# Patient Record
Sex: Female | Born: 1975 | Race: Black or African American | Hispanic: No | Smoking: Never smoker
Health system: Southern US, Community
[De-identification: ages and names within clinical notes are randomized; demographics above are authoritative.]

## PROBLEM LIST (undated history)

## (undated) DIAGNOSIS — M1990 Unspecified osteoarthritis, unspecified site: Secondary | ICD-10-CM

## (undated) DIAGNOSIS — G629 Polyneuropathy, unspecified: Secondary | ICD-10-CM

## (undated) HISTORY — PX: TYMPANOSTOMY TUBE PLACEMENT: SHX32

## (undated) HISTORY — PX: TONSILLECTOMY AND ADENOIDECTOMY: SUR1326

## (undated) HISTORY — PX: SOFT TISSUE BIOPSY: SHX1053

---

## 2000-06-26 ENCOUNTER — Other Ambulatory Visit: Admission: RE | Admit: 2000-06-26 | Discharge: 2000-06-26 | Payer: Self-pay | Admitting: General Surgery

## 2000-06-26 ENCOUNTER — Encounter (INDEPENDENT_AMBULATORY_CARE_PROVIDER_SITE_OTHER): Payer: Self-pay

## 2003-04-23 ENCOUNTER — Other Ambulatory Visit: Admission: RE | Admit: 2003-04-23 | Discharge: 2003-04-23 | Payer: Self-pay | Admitting: Family Medicine

## 2004-09-14 ENCOUNTER — Other Ambulatory Visit: Admission: RE | Admit: 2004-09-14 | Discharge: 2004-09-14 | Payer: Self-pay | Admitting: Emergency Medicine

## 2005-10-27 ENCOUNTER — Other Ambulatory Visit: Admission: RE | Admit: 2005-10-27 | Discharge: 2005-10-27 | Payer: Self-pay | Admitting: Emergency Medicine

## 2007-04-16 ENCOUNTER — Encounter: Admission: RE | Admit: 2007-04-16 | Discharge: 2007-04-16 | Payer: Self-pay | Admitting: Emergency Medicine

## 2007-07-12 ENCOUNTER — Other Ambulatory Visit: Admission: RE | Admit: 2007-07-12 | Discharge: 2007-07-12 | Payer: Self-pay | Admitting: Obstetrics and Gynecology

## 2008-07-27 ENCOUNTER — Other Ambulatory Visit: Admission: RE | Admit: 2008-07-27 | Discharge: 2008-07-27 | Payer: Self-pay | Admitting: Obstetrics and Gynecology

## 2010-01-21 ENCOUNTER — Other Ambulatory Visit: Admission: RE | Admit: 2010-01-21 | Discharge: 2010-01-21 | Payer: Self-pay | Admitting: Family Medicine

## 2010-11-13 ENCOUNTER — Encounter: Payer: Self-pay | Admitting: Family Medicine

## 2013-07-30 ENCOUNTER — Ambulatory Visit (INDEPENDENT_AMBULATORY_CARE_PROVIDER_SITE_OTHER): Payer: Self-pay | Admitting: General Surgery

## 2013-08-20 ENCOUNTER — Encounter (INDEPENDENT_AMBULATORY_CARE_PROVIDER_SITE_OTHER): Payer: Self-pay | Admitting: General Surgery

## 2013-08-20 ENCOUNTER — Encounter (INDEPENDENT_AMBULATORY_CARE_PROVIDER_SITE_OTHER): Payer: Self-pay

## 2013-08-20 ENCOUNTER — Ambulatory Visit (INDEPENDENT_AMBULATORY_CARE_PROVIDER_SITE_OTHER): Payer: BC Managed Care – PPO | Admitting: General Surgery

## 2013-08-20 VITALS — BP 136/72 | HR 60 | Temp 97.1°F | Resp 16 | Ht 64.75 in | Wt 173.0 lb

## 2013-08-20 DIAGNOSIS — K602 Anal fissure, unspecified: Secondary | ICD-10-CM

## 2013-08-20 MED ORDER — LIDOCAINE 5 % EX OINT: TOPICAL_OINTMENT | CUTANEOUS | Status: DC | PRN

## 2013-08-20 NOTE — Patient Instructions (Signed)

## 2013-08-20 NOTE — Progress Notes (Signed)
Chief Complaint  Patient presents with  . New Evaluation    eval hems    HISTORY: Judith Foster is a 37 y.o. female who presents to the office with rectal bleeding with BM's.  Other symptoms include anal pain.  This had been occurring for months.  She has tried proctofoam and anusol in the past which made it worse.    BM's makes the symptoms worse.   It is intermittent in nature.  Her bowel habits are regular and her bowel movements are soft.  She does report inferior to have bowel movements.  She does strain to have BM's.  She states occasional sharp tearing pain with bowel movements.  Her fiber intake is dietary.  She has never had a colonoscopy.  She denies prolapsing tissue.     History reviewed. No pertinent past medical history.    Past Surgical History  Procedure Laterality Date  . Soft tissue biopsy    . Tympanostomy tube placement          Current Outpatient Prescriptions  Medication Sig Dispense Refill  . lidocaine (XYLOCAINE) 5 % ointment Apply topically as needed.  35.44 g  0  . valACYclovir (VALTREX) 500 MG tablet Take 500 mg by mouth 2 (two) times daily.       No current facility-administered medications for this visit.      Allergies  Allergen Reactions  . Asa [Aspirin] Swelling    Swelling of eyes and face  . Meloxicam Swelling    Swelling of eyes and face  . Phenergan [Promethazine Hcl] Nausea Only      Family History  Problem Relation Age of Onset  . Cancer Maternal Grandfather     ?prostate    History   Social History  . Marital Status: Divorced    Spouse Name: N/A    Number of Children: N/A  . Years of Education: N/A   Social History Main Topics  . Smoking status: Never Smoker   . Smokeless tobacco: Never Used  . Alcohol Use: No  . Drug Use: No  . Sexual Activity: None   Other Topics Concern  . None   Social History Narrative  . None      REVIEW OF SYSTEMS - PERTINENT POSITIVES ONLY: Review of Systems - General ROS: negative for -  chills, fever or weight loss Hematological and Lymphatic ROS: negative for - bleeding problems, blood clots or bruising Respiratory ROS: no cough, shortness of breath, or wheezing Cardiovascular ROS: no chest pain or dyspnea on exertion Gastrointestinal ROS: positive for - blood in stools negative for - abdominal pain, change in bowel habits, constipation or diarrhea Genito-Urinary ROS: no dysuria, trouble voiding, or hematuria  EXAM: Filed Vitals:   08/20/13 1504  BP: 136/72  Pulse: 60  Temp: 97.1 F (36.2 C)  Resp: 16    General appearance: alert and cooperative Resp: clear to auscultation bilaterally Cardio: regular rate and rhythm GI: normal findings: soft, non-tender  Anal Findings: posterior fissure, sphincter hypertension    ASSESSMENT AND PLAN: Judith Foster is a 37 year old female with signs and symptoms most consistent with a posterior anal fissure. I have given her a prescription for lidocaine ointment to use prior to bowel movements. She's going to do sitz baths after bowel movements. She's going to use diltiazem cream 3-4 times a day to help with her fissure healing. We discussed that she may not notice any difference for the first couple weeks but then it should gradually started to get better.  She'll call the office if this is not the case after 4 weeks. I will see her at 8 weeks at the end of her diltiazem treatment. We may need to do further anoscopy is she is still continuing to have anal bleeding.    Vanita Panda, MD Colon and Rectal Surgery / General Surgery [facility]      Visit Diagnoses: 1. Anal fissure     Primary Care Physician: Carilyn Goodpasture, PA-C

## 2013-10-09 ENCOUNTER — Encounter (INDEPENDENT_AMBULATORY_CARE_PROVIDER_SITE_OTHER): Payer: BC Managed Care – PPO | Admitting: General Surgery

## 2013-11-05 ENCOUNTER — Encounter (INDEPENDENT_AMBULATORY_CARE_PROVIDER_SITE_OTHER): Payer: Self-pay | Admitting: General Surgery

## 2013-11-05 ENCOUNTER — Encounter (INDEPENDENT_AMBULATORY_CARE_PROVIDER_SITE_OTHER): Payer: Self-pay

## 2013-11-05 ENCOUNTER — Ambulatory Visit (INDEPENDENT_AMBULATORY_CARE_PROVIDER_SITE_OTHER): Payer: BC Managed Care – PPO | Admitting: General Surgery

## 2013-11-05 VITALS — BP 123/81 | HR 77 | Temp 98.1°F | Resp 18 | Ht 64.0 in | Wt 175.8 lb

## 2013-11-05 DIAGNOSIS — K6289 Other specified diseases of anus and rectum: Secondary | ICD-10-CM

## 2013-11-05 NOTE — Patient Instructions (Signed)
Take a stool softener twice daily or Miralax daily.  Continue diltiazem ointment.  Call the office if your symptoms are not better in the next 4-6 weeks

## 2013-11-05 NOTE — Progress Notes (Signed)
Judith Foster is a 38 y.o. female who is here for a follow up visit regarding her anal pain.  Her bleeding is resolved but she is still having pain intermittently.  She denies any consistent straining or hard BM's, but she still has some hard BM's on occasion.   Objective: Filed Vitals:   11/05/13 1422  BP: 123/81  Pulse: 77  Temp: 98.1 F (36.7 C)  Resp: 18    General appearance: alert and cooperative GI: normal findings: soft, non-tender Anal exam: I see no evidence of fissure at this time. 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 I was able to examine her anal canal with a small endoscope and did not see any major signs of pathology.  Assessment and Plan: We had a discussion on where to go next. She would like to use stool softeners for the next few weeks while continuing the diltiazem ointment. She will call the office if this does not help relieve her symptoms. I believe the next up in her diagnosis would be in anal exam under anesthesia, as she is unable to tolerate a full anal exam and the office.    Vanita Panda.Candida Vetter C Afsheen Antony, MD [facility], [place] [address] [phone]

## 2014-03-20 ENCOUNTER — Other Ambulatory Visit: Payer: Self-pay | Admitting: Family Medicine

## 2014-03-20 ENCOUNTER — Ambulatory Visit
Admission: RE | Admit: 2014-03-20 | Discharge: 2014-03-20 | Disposition: A | Discharge status: Home / Self Care | Payer: BC Managed Care – PPO | Source: Ambulatory Visit | Attending: Family Medicine | Admitting: Family Medicine

## 2014-03-20 DIAGNOSIS — M25519 Pain in unspecified shoulder: Secondary | ICD-10-CM

## 2015-07-30 ENCOUNTER — Other Ambulatory Visit (HOSPITAL_COMMUNITY)
Admission: RE | Admit: 2015-07-30 | Discharge: 2015-07-30 | Disposition: A | Discharge status: Home / Self Care | Payer: BC Managed Care – PPO | Source: Ambulatory Visit | Attending: Family Medicine | Admitting: Family Medicine

## 2015-07-30 ENCOUNTER — Other Ambulatory Visit: Payer: Self-pay | Admitting: Family Medicine

## 2015-07-30 DIAGNOSIS — Z1151 Encounter for screening for human papillomavirus (HPV): Secondary | ICD-10-CM | POA: Diagnosis present

## 2015-07-30 DIAGNOSIS — Z01411 Encounter for gynecological examination (general) (routine) with abnormal findings: Secondary | ICD-10-CM | POA: Diagnosis present

## 2015-08-03 LAB — CYTOLOGY - PAP

## 2016-05-23 ENCOUNTER — Ambulatory Visit
Admission: RE | Admit: 2016-05-23 | Discharge: 2016-05-23 | Disposition: A | Discharge status: Home / Self Care | Payer: BC Managed Care – PPO | Source: Ambulatory Visit | Attending: Family Medicine | Admitting: Family Medicine

## 2016-05-23 ENCOUNTER — Other Ambulatory Visit: Payer: Self-pay | Admitting: Family Medicine

## 2016-05-23 DIAGNOSIS — N760 Acute vaginitis: Secondary | ICD-10-CM

## 2017-09-07 NOTE — Patient Instructions (Signed)
Your procedure is scheduled on:  Wednesday, Dec. 5, 2018  [facility]:  11:00 AM  Pick up the phone at the desk and dial [phone].  Call this number if you have problems the morning of surgery: [phone].  Remember: Do NOT eat food or drink after:  Midnight Tuesday  Take these medicines the morning of surgery with a SIP OF WATER:  None  Stop ALL herbal medications at this time  Do NOT smoke the day of surgery.  Do NOT wear jewelry (body piercing), metal hair clips/bobby pins, make-up, artifical eyelashes or nail polish. Do NOT wear lotions, powders, or perfumes.  You may wear deodorant. Do NOT shave for 48 hours prior to surgery. Do NOT bring valuables to the hospital. Contacts, dentures, or bridgework may not be worn into surgery.  Have a responsible adult drive you home and stay with you for 24 hours after your procedure  Bring a copy of your healthcare power of attorney and living will documents.  [facility] - Preparing for Surgery Before surgery, you can play an important role.  Because skin is not sterile, your skin needs to be as free of germs as possible.  You can reduce the number of germs on your skin by washing with CHG (chlorahexidine gluconate) soap before surgery.  CHG is an antiseptic cleaner which kills germs and bonds with the skin to continue killing germs even after washing. Please DO NOT use if you have an allergy to CHG or antibacterial soaps.  If your skin becomes reddened/irritated stop using the CHG and inform your nurse when you arrive at Short Stay. Do not shave (including legs and underarms) for at least 48 hours prior to the first CHG shower.  You may shave your face/neck.  Please follow these instructions carefully:  1.  Shower with CHG Soap the night before surgery and the  morning of surgery.  2.  If you choose to wash your hair, wash your hair first as usual with your normal  shampoo.  3.  After you  shampoo, rinse your hair and body thoroughly to remove the shampoo.                             4.  Use CHG as you would any other liquid soap.  You can apply chg directly to the skin and wash.  Gently with a scrungie or clean washcloth.  5.  Apply the CHG Soap to your body ONLY FROM THE NECK DOWN.   Do   not use on face/ open                           Wound or open sores. Avoid contact with eyes, ears mouth and   genitals (private parts).                       Wash face,  Genitals (private parts) with your normal soap.             6.  Wash thoroughly, paying special attention to the area where your    surgery  will be performed.  7.  Thoroughly rinse your body with warm water from the neck down.  8.  DO NOT shower/wash with your normal soap after using and rinsing off the CHG Soap.  9.  Pat yourself dry with a clean towel.            10.  Wear clean pajamas.            11.  Place clean sheets on your bed the night of your first shower and do not  sleep with pets. Day of Surgery : Do not apply any lotions/deodorants the morning of surgery.  Please wear clean clothes to the hospital/surgery center.  FAILURE TO FOLLOW THESE INSTRUCTIONS MAY RESULT IN THE CANCELLATION OF YOUR SURGERY  PATIENT SIGNATURE_________________________________  NURSE SIGNATURE__________________________________  ________________________________________________________________________

## 2017-09-17 ENCOUNTER — Other Ambulatory Visit: Payer: Self-pay | Admitting: Obstetrics and Gynecology

## 2017-09-17 NOTE — H&P (Signed)
Chief Complaint(s):   PreOp for 09/26/17 ocmplex right ovarian cyst   HPI:  General 41 y/o presents for preop history and physical exam in preparation for laparoscopic Right ovarian cystectomy with possible right oophorectomy. for management of right ovarian mass. her ultrasound 07/26/2017 revealed a 10.2 cm x 6.4 cm x 7.1 cm uterus. the endometrium is thin 3.5 mm. she has a posterior submucosal fibroid 4.3 cm anterior 1.3 cm. fibroids are stable from previous ultrasound 05/23/2017. the right Ovary contains a complex hyperechoic mass with posterior shadowing measuring 4.7 cm . it is avascular and has appearance of a possible dermoid. the left ovary appears normal.  Current Medication:   None   Medical History:   GYN - [facility] - Dr. Richardson Doppole in past     infertility     HSV-II     CAN TAKE IBUPROFEN/ADVIL     ortho- Dr. Althea CharonMcKinley     ulnar neuropathy     onset bilateral hand pain- relieved with steroids- (+) FH of RA- neg arthritis panel     arthritis in neck      Allergies/Intolerance:   Aspirin: Allergy - swelling     Phenergan: Side Effects - nausea/dizziness     Meloxicam: Allergy - eyes swelling     Keflex - ?hives 10/22/15 TE     Bactrim DS - ?hives 10/22/15- TE   Gyn History:   Sexual activity currently sexually active at times. Periods : regular. LMP 09/16/2017. Birth control none. Last pap smear date 07/30/15 - WNL negative HPV. Denies Last mammogram date. Denies Abnormal pap smear. STD HSV II. Menarche 8.   OB History:   Never been pregnant per patient.   Surgical History:   tonsillectomy 06/1998     underarm biopsy 06/2000     tubes in ears 1970's   Hospitalization:   tonsillectomy 1970's   Family History:   Father: alive 7170 yrs, smoker    Mother: alive 6968 yrs, high blood pressure, fibromyalgia, arthritis, diagnosed with Hypertension    Paternal Grand Father: deceased    Paternal Grand Mother: deceased    Maternal Grand Father: deceased, cancer    Maternal Grand Mother: deceased, arthritis- ? type    Brother 1: alive 32 yrs, A + W    1 brother(s) .    No hx of Breast, Ovarian , or Colon Cancer. Aunt age 42 had stroke. (+) DM in 2nd degree relatives.  Social History:  General Tobacco use cigarettes: Never smoked, Tobacco history last updated 09/17/2017.  no EXPOSURE TO PASSIVE SMOKE, in the past - father.  Alcohol: yes, 1 serving per year.  Caffeine: yes, tea 1 serving per month.  no Recreational drug use.  Exercise: yes, gym, 4x a week.  Marital Status: single, Divorced.  Children: none.  OCCUPATION: employed, computer/technology work.  ROS: CONSTITUTIONAL No" options="no,yes" propid="91" itemid="193425" categoryid="10464" encounterid="[id number]"Chills No. No" options="no,yes" propid="91" itemid="172899" categoryid="10464" encounterid="[id number]"Fatigue No. No" options="no,yes" propid="91" itemid="10467" categoryid="10464" encounterid="[id number]"Fever No. No" options="no,yes" propid="91" itemid="193426" categoryid="10464" encounterid="[id number]"Night sweats No. No" options="no,yes" propid="91" itemid="444261" categoryid="10464" encounterid="[id number]"Recent travel outside [place] No. No" options="no,yes" propid="91" itemid="193427" categoryid="10464" encounterid="[id number]"Sweats No. No" options="no,yes" propid="91" itemid="194825" categoryid="10464" encounterid="[id number]"Weight change No.  OPHTHALMOLOGY no" options="no,yes" propid="91" itemid="12520" categoryid="12516" encounterid="[id number]"Blurring of vision no. no" options="no,yes" propid="91" itemid="193469" categoryid="12516" encounterid="[id number]"Change in vision no. no" options="no,yes" propid="91" itemid="194379" categoryid="12516" encounterid="[id number]"Double vision no.  ENT no" options="no,yes" propid="91" itemid="193612" categoryid="10481" encounterid="[id number]"Dizziness no. Nose bleeds no. Sore throat no. Teeth pain no.  ALLERGY no" options="no,yes" propid="91" itemid="202589"  categoryid="138152" encounterid="[id number]"Hives no.  CARDIOLOGY no" options="no,yes"  propid="91" [phone]" categoryid="10488" encounterid="[id number]"Chest pain no. no" options="no,yes" propid="91" itemid="199089" categoryid="10488" encounterid="[id number]"High blood pressure no. no" options="no,yes" propid="91" itemid="202598" categoryid="10488" encounterid="[id number]"Irregular heart beat no. no" options="no,yes" propid="91" itemid="10491" categoryid="10488" encounterid="[id number]"Leg edema no. no" options="no,yes" propid="91" itemid="10490" categoryid="10488" encounterid="[id number]"Palpitations no.  RESPIRATORY no" options="no" propid="91" itemid="270013" categoryid="138132" encounterid="[id number]"Shortness of breath no. no" options="no,yes" propid="91" itemid="172745" categoryid="138132" encounterid="[id number]"Cough no. no" options="no,yes" propid="91" itemid="193621" categoryid="138132" encounterid="[id number]"Wheezing no.  UROLOGY no" options="no,yes" propid="91" [phone]" categoryid="138166" encounterid="[id number]"Pain with urination no. no" options="no,yes" propid="91" itemid="193493" categoryid="138166" encounterid="[id number]"Urinary urgency no. no" options="no,yes" propid="91" itemid="193492" categoryid="138166" encounterid="[id number]"Urinary frequency no. no" options="no,yes" propid="91" itemid="138171" categoryid="138166" encounterid="[id number]"Urinary incontinence no. No" options="no,yes" propid="91" itemid="138167" categoryid="138166" encounterid="[id number]"Difficulty urinating No. No" options="no,yes" propid="91" itemid="138168" categoryid="138166" encounterid="[id number]"Blood in urine No.  GASTROENTEROLOGY no" options="no,yes" propid="91" itemid="10496" categoryid="10494" encounterid="[id number]"Abdominal pain no. no" options="no,yes" propid="91" itemid="193447" categoryid="10494" encounterid="[id number]"Appetite change no. no" options="no,yes" propid="91" itemid="193448" categoryid="10494"  encounterid="[id number]"Bloating/belching no. no" options="no,yes" propid="91" itemid="10503" categoryid="10494" encounterid="[id number]"Blood in stool or on toilet paper no. no" options="no,yes" propid="91" itemid="199106" categoryid="10494" encounterid="[id number]"Change in bowel movements no. no" options="no,yes" propid="91" itemid="10501" categoryid="10494" encounterid="[id number]"Constipation no. no" options="no,yes" propid="91" itemid="10502" categoryid="10494" encounterid="[id number]"Diarrhea no. no" options="no,yes" propid="91" itemid="199104" categoryid="10494" encounterid="[id number]"Difficulty swallowing no. no" options="no,yes" propid="91" itemid="10499" categoryid="10494" encounterid="[id number]"Nausea no.  FEMALE REPRODUCTIVE no" options="no,yes" propid="91" itemid="453725" categoryid="10525" encounterid="[id number]"Vulvar pain no. no" options="no,yes" propid="91" itemid="453726" categoryid="10525" encounterid="[id number]"Vulvar rash no. no" options="no, yes" propid="91" itemid="444315" categoryid="10525" encounterid="[id number]"Abnormal vaginal bleeding no. no" options="no,yes" propid="91" itemid="186083" categoryid="10525" encounterid="[id number]"Breast pain no. no" options="no,yes" propid="91" itemid="186084" categoryid="10525" encounterid="[id number]"Nipple discharge no. no" options="no,yes" propid="91" itemid="275823" categoryid="10525" encounterid="[id number]"Pain with intercourse no. no" options="no,yes" propid="91" itemid="186082" categoryid="10525" encounterid="[id number]"Pelvic pain no. no" options="no,yes" propid="91" itemid="278230" categoryid="10525" encounterid="[id number]"Unusual vaginal discharge no. no" options="no,yes" propid="91" itemid="278942" categoryid="10525" encounterid="[id number]"Vaginal itching no.  MUSCULOSKELETAL no" options="no,yes" propid="91" itemid="193461" categoryid="10514" encounterid="[id number]"Muscle aches no.  NEUROLOGY no" options="no,yes" propid="91" itemid="12513" categoryid="12512"  encounterid="[id number]"Headache no. no" options="no,yes" propid="91" itemid="12514" categoryid="12512" encounterid="[id number]"Tingling/numbness no. no" options="no,yes" propid="91" itemid="193468" categoryid="12512" encounterid="9875914"Weakness no.  PSYCHOLOGY no" options="" propid="91" itemid="275919" categoryid="10520" encounterid="9875914"Depression no. no" options="no,yes" propid="91" itemid="172748" categoryid="10520" encounterid="[id number]"Anxiety no. no" options="no,yes" propid="91" itemid="199158" categoryid="10520" encounterid="9875914"Nervousness no. no" options="no,yes" propid="91" itemid="12502" categoryid="10520" encounterid="9875914"Sleep disturbances no. no " options="no,yes" propid="91" itemid="72718" categoryid="10520" encounterid="9875914"Suicidal ideation no .  ENDOCRINOLOGY no" options="no,yes" propid="91" itemid="194628" categoryid="12508" encounterid="[id number]"Excessive thirst no. no" options="no,yes" propid="91" itemid="196285" categoryid="12508" encounterid="9875914"Excessive urination no. no" options="no, yes" propid="91" itemid="444314" categoryid="12508" encounterid="9875914"Hair loss no. no" options="" propid="91" itemid="447284" categoryid="12508" encounterid="[id number]"Heat or cold intolerance no.  HEMATOLOGY/LYMPH no" options="no,yes" propid="91" itemid="199152" categoryid="138157" encounterid="9875914"Abnormal bleeding no. no" options="no,yes" propid="91" itemid="170653" categoryid="138157" encounterid="9875914"Easy bruising no. no" options="no,yes" propid="91" itemid="138158" categoryid="138157" encounterid="9875914"Swollen glands no.  DERMATOLOGY no" options="no,yes" propid="91" itemid="199126" categoryid="12503" encounterid="[id number]"New/changing skin lesion no. no" options="no,yes" propid="91" itemid="12504" categoryid="12503" encounterid="9875914"Rash no. no" options="" propid="91" itemid="444313" categoryid="12503" encounterid="[id number]"Sores no.  Negative except as stated in  HPI.   Objective: Vitals:  Wt 182, Wt change -3 lb, Ht 63.5, BMI 31.73, Pulse sitting 72, BP sitting 122/80  Past Results:  Examination:  General Examination alert, oriented, NAD " categoryPropId="10089" examid="193638"GENERAL APPEARANCE alert, oriented, NAD .  moist, warm " categoryPropId="10109" examid="193638"SKIN: moist, warm .  Conjunctiva clear " categoryPropId="21468" examid="193638"EYES: Conjunctiva clear .  clear to auscultation bilaterally " categoryPropId="87" examid="193638"LUNGS: clear to auscultation bilaterally .  regular rate and rhythm " categoryPropId="86" examid="193638"HEART: regular rate and rhythm .  soft, non-tender/non-distended, bowel sounds present " categoryPropId="88" examid="193638"ABDOMEN: soft, non-tender/non-distended, bowel sounds present .  normal external genitalia, labia - unremarkable, vagina - pink moist mucosa, no lesions or abnormal discharge, cervix - no discharge or lesions or CMT, adnexa - no masses or tenderness, uterus - nontender and normal size on palpation " categoryPropId="13414" examid="193638"FEMALE GENITOURINARY: normal external genitalia, labia - unremarkable, vagina - pink moist mucosa, no lesions or  abnormal discharge, cervix - no discharge or lesions or CMT, adnexa - no masses or tenderness, uterus - nontender and normal size on palpation .  normal range of motion, no edema present" categoryPropId="89" examid="193638"EXTREMITIES: normal range of motion, no edema present.  affect normal, good eye contact " categoryPropId="16316" examid="193638"PSYCH: affect normal, good eye contact .  Physical Examination:   Assessment: Assessment:  Complex cyst of right ovary - N83.291 (Primary)     Plan: Treatment:  Complex cyst of right ovary  Notes: laparoscopic right ovarian cystectomy possbile Right oophorectomy discussed with the patient including but not limited to infection / bleeding damage to bowel bladder ureters with the need for further  surgery. Pt voiced understanding and desires to proceed.

## 2017-09-17 NOTE — H&P (Deleted)
  The note originally documented on this encounter has been moved the the encounter in which it belongs.  

## 2017-09-18 ENCOUNTER — Other Ambulatory Visit: Payer: Self-pay

## 2017-09-18 ENCOUNTER — Encounter (HOSPITAL_COMMUNITY): Payer: Self-pay

## 2017-09-18 ENCOUNTER — Encounter (HOSPITAL_COMMUNITY)
Admission: RE | Admit: 2017-09-18 | Discharge: 2017-09-18 | Disposition: A | Discharge status: Home / Self Care | Payer: BC Managed Care – PPO | Source: Ambulatory Visit | Attending: Obstetrics and Gynecology | Admitting: Obstetrics and Gynecology

## 2017-09-18 DIAGNOSIS — Z01818 Encounter for other preprocedural examination: Secondary | ICD-10-CM | POA: Diagnosis present

## 2017-09-18 HISTORY — DX: Polyneuropathy, unspecified: G62.9

## 2017-09-18 HISTORY — DX: Unspecified osteoarthritis, unspecified site: M19.90

## 2017-09-18 LAB — CBC
HEMATOCRIT: 37.2 % (ref 36.0–46.0)
HEMOGLOBIN: 12.2 g/dL (ref 12.0–15.0)
MCH: 28.8 pg (ref 26.0–34.0)
MCHC: 32.8 g/dL (ref 30.0–36.0)
MCV: 87.9 fL (ref 78.0–100.0)
PLATELETS: 331 10*3/uL (ref 150–400)
RBC: 4.23 MIL/uL (ref 3.87–5.11)
RDW: 12.7 % (ref 11.5–15.5)
WBC: 5.8 10*3/uL (ref 4.0–10.5)

## 2017-09-18 LAB — TYPE AND SCREEN
ABO/RH(D): O POS
Antibody Screen: NEGATIVE

## 2017-09-18 LAB — ABO/RH: ABO/RH(D): O POS

## 2017-09-26 ENCOUNTER — Ambulatory Visit (HOSPITAL_COMMUNITY): Payer: BC Managed Care – PPO | Admitting: Anesthesiology

## 2017-09-26 ENCOUNTER — Encounter (HOSPITAL_COMMUNITY): Payer: Self-pay

## 2017-09-26 ENCOUNTER — Ambulatory Visit (HOSPITAL_COMMUNITY)
Admission: AD | Admit: 2017-09-26 | Discharge: 2017-09-26 | Disposition: A | Discharge status: Home / Self Care | Payer: BC Managed Care – PPO | Source: Ambulatory Visit | Attending: Obstetrics and Gynecology | Admitting: Obstetrics and Gynecology

## 2017-09-26 ENCOUNTER — Encounter (HOSPITAL_COMMUNITY)
Admission: AD | Disposition: A | Discharge status: Home / Self Care | Payer: Self-pay | Source: Ambulatory Visit | Attending: Obstetrics and Gynecology

## 2017-09-26 DIAGNOSIS — M1990 Unspecified osteoarthritis, unspecified site: Secondary | ICD-10-CM | POA: Diagnosis not present

## 2017-09-26 DIAGNOSIS — N83201 Unspecified ovarian cyst, right side: Secondary | ICD-10-CM | POA: Diagnosis present

## 2017-09-26 DIAGNOSIS — N83291 Other ovarian cyst, right side: Secondary | ICD-10-CM | POA: Diagnosis present

## 2017-09-26 HISTORY — PX: LAPAROSCOPIC OVARIAN CYSTECTOMY: SHX6248

## 2017-09-26 LAB — PREGNANCY, URINE: Preg Test, Ur: NEGATIVE

## 2017-09-26 SURGERY — EXCISION, CYST, OVARY, LAPAROSCOPIC
Anesthesia: General | Site: Abdomen | Laterality: Right

## 2017-09-26 MED ORDER — IBUPROFEN 800 MG PO TABS: 800.0000 mg | ORAL_TABLET | Freq: Three times a day (TID) | ORAL | 0 refills | Status: AC | PRN

## 2017-09-26 MED ORDER — SCOPOLAMINE 1 MG/3DAYS TD PT72
1.0000 | MEDICATED_PATCH | Freq: Once | TRANSDERMAL | Status: DC
Start: 2017-09-26 — End: 2017-09-27
  Administered 2017-09-26: 1.5 mg via TRANSDERMAL

## 2017-09-26 MED ORDER — LIDOCAINE HCL (CARDIAC) 20 MG/ML IV SOLN
INTRAVENOUS | Status: AC
  Filled 2017-09-26: qty 5

## 2017-09-26 MED ORDER — GLYCOPYRROLATE 0.2 MG/ML IJ SOLN
INTRAMUSCULAR | Status: DC | PRN
  Administered 2017-09-26 (×2): 0.1 mg via INTRAVENOUS

## 2017-09-26 MED ORDER — HYDROCODONE-ACETAMINOPHEN 5-325 MG PO TABS: 1.0000 | ORAL_TABLET | Freq: Four times a day (QID) | ORAL | 0 refills | Status: AC | PRN

## 2017-09-26 MED ORDER — LACTATED RINGERS IV SOLN
INTRAVENOUS | Status: DC
Start: 2017-09-26 — End: 2017-09-27
  Administered 2017-09-26 (×3): via INTRAVENOUS

## 2017-09-26 MED ORDER — MIDAZOLAM HCL 2 MG/2ML IJ SOLN
INTRAMUSCULAR | Status: DC | PRN
  Administered 2017-09-26: 2 mg via INTRAVENOUS

## 2017-09-26 MED ORDER — SUGAMMADEX SODIUM 200 MG/2ML IV SOLN
INTRAVENOUS | Status: DC | PRN
  Administered 2017-09-26: 160 mg via INTRAVENOUS

## 2017-09-26 MED ORDER — PROPOFOL 10 MG/ML IV BOLUS
INTRAVENOUS | Status: DC | PRN
  Administered 2017-09-26: 200 mg via INTRAVENOUS

## 2017-09-26 MED ORDER — PROPOFOL 10 MG/ML IV BOLUS
INTRAVENOUS | Status: AC
  Filled 2017-09-26: qty 20

## 2017-09-26 MED ORDER — DEXAMETHASONE SODIUM PHOSPHATE 10 MG/ML IJ SOLN
INTRAMUSCULAR | Status: DC | PRN
  Administered 2017-09-26: 8 mg via INTRAVENOUS

## 2017-09-26 MED ORDER — FENTANYL CITRATE (PF) 250 MCG/5ML IJ SOLN
INTRAMUSCULAR | Status: AC
  Filled 2017-09-26: qty 5

## 2017-09-26 MED ORDER — SUGAMMADEX SODIUM 200 MG/2ML IV SOLN
INTRAVENOUS | Status: AC
  Filled 2017-09-26: qty 2

## 2017-09-26 MED ORDER — ONDANSETRON HCL 4 MG/2ML IJ SOLN
INTRAMUSCULAR | Status: DC | PRN
  Administered 2017-09-26: 4 mg via INTRAVENOUS

## 2017-09-26 MED ORDER — MIDAZOLAM HCL 2 MG/2ML IJ SOLN
INTRAMUSCULAR | Status: AC
  Filled 2017-09-26: qty 2

## 2017-09-26 MED ORDER — DEXAMETHASONE SODIUM PHOSPHATE 10 MG/ML IJ SOLN
INTRAMUSCULAR | Status: AC
  Filled 2017-09-26: qty 1

## 2017-09-26 MED ORDER — ROCURONIUM BROMIDE 100 MG/10ML IV SOLN
INTRAVENOUS | Status: DC | PRN
  Administered 2017-09-26: 5 mg via INTRAVENOUS
  Administered 2017-09-26: 35 mg via INTRAVENOUS

## 2017-09-26 MED ORDER — LIDOCAINE HCL (CARDIAC) 20 MG/ML IV SOLN
INTRAVENOUS | Status: DC | PRN
  Administered 2017-09-26: 80 mg via INTRAVENOUS

## 2017-09-26 MED ORDER — KETOROLAC TROMETHAMINE 30 MG/ML IJ SOLN
INTRAMUSCULAR | Status: AC
  Filled 2017-09-26: qty 1

## 2017-09-26 MED ORDER — FENTANYL CITRATE (PF) 100 MCG/2ML IJ SOLN
INTRAMUSCULAR | Status: DC | PRN
  Administered 2017-09-26 (×4): 50 ug via INTRAVENOUS

## 2017-09-26 MED ORDER — GLYCOPYRROLATE 0.2 MG/ML IJ SOLN
INTRAMUSCULAR | Status: AC
  Filled 2017-09-26: qty 1

## 2017-09-26 MED ORDER — ONDANSETRON HCL 4 MG/2ML IJ SOLN
INTRAMUSCULAR | Status: AC
  Filled 2017-09-26: qty 2

## 2017-09-26 MED ORDER — SODIUM CHLORIDE 0.9 % IR SOLN
Status: DC | PRN
  Administered 2017-09-26: 3000 mL

## 2017-09-26 MED ORDER — ROCURONIUM BROMIDE 100 MG/10ML IV SOLN
INTRAVENOUS | Status: AC
  Filled 2017-09-26: qty 1

## 2017-09-26 MED ORDER — FENTANYL CITRATE (PF) 100 MCG/2ML IJ SOLN
25.0000 ug | INTRAMUSCULAR | Status: DC | PRN
Start: 2017-09-26 — End: 2017-09-27

## 2017-09-26 MED ORDER — BUPIVACAINE HCL (PF) 0.25 % IJ SOLN
INTRAMUSCULAR | Status: DC | PRN
  Administered 2017-09-26: 30 mL

## 2017-09-26 MED ORDER — SCOPOLAMINE 1 MG/3DAYS TD PT72
MEDICATED_PATCH | TRANSDERMAL | Status: AC
  Administered 2017-09-26: 1.5 mg via TRANSDERMAL
  Filled 2017-09-26: qty 1

## 2017-09-26 SURGICAL SUPPLY — 32 items
BAG SPEC RTRVL LRG 6X4 10 (ENDOMECHANICALS) ×1
CABLE HIGH FREQUENCY MONO STRZ (ELECTRODE) IMPLANT
DILATOR CANAL MILEX (MISCELLANEOUS) ×2 IMPLANT
DRSG OPSITE POSTOP 3X4 (GAUZE/BANDAGES/DRESSINGS) ×2 IMPLANT
GLOVE BIOGEL M 6.5 STRL (GLOVE) ×6 IMPLANT
GLOVE BIOGEL PI IND STRL 6.5 (GLOVE) ×1 IMPLANT
GLOVE BIOGEL PI IND STRL 7.0 (GLOVE) ×1 IMPLANT
GLOVE BIOGEL PI INDICATOR 6.5 (GLOVE) ×2
GLOVE BIOGEL PI INDICATOR 7.0 (GLOVE) ×2
GOWN STRL REUS W/TWL LRG LVL3 (GOWN DISPOSABLE) ×6 IMPLANT
NS IRRIG 1000ML POUR BTL (IV SOLUTION) ×3 IMPLANT
PACK LAPAROSCOPY BASIN (CUSTOM PROCEDURE TRAY) ×3 IMPLANT
PACK TRENDGUARD 450 HYBRID PRO (MISCELLANEOUS) IMPLANT
PACK TRENDGUARD 600 HYBRD PROC (MISCELLANEOUS) IMPLANT
POUCH SPECIMEN RETRIEVAL 10MM (ENDOMECHANICALS) ×3 IMPLANT
PROTECTOR NERVE ULNAR (MISCELLANEOUS) ×6 IMPLANT
SEALER TISSUE G2 CVD JAW 35 (ENDOMECHANICALS) IMPLANT
SEALER TISSUE G2 CVD JAW 45CM (ENDOMECHANICALS) IMPLANT
SET IRRIG TUBING LAPAROSCOPIC (IRRIGATION / IRRIGATOR) ×2 IMPLANT
SHEARS HARMONIC ACE PLUS 36CM (ENDOMECHANICALS) ×2 IMPLANT
SOLUTION ELECTROLUBE (MISCELLANEOUS) IMPLANT
SUT VIC AB 0 CTX 36 (SUTURE)
SUT VIC AB 0 CTX36XBRD ANBCTRL (SUTURE) IMPLANT
SUT VIC AB 4-0 PS2 27 (SUTURE) ×3 IMPLANT
SUT VICRYL 0 UR6 27IN ABS (SUTURE) ×3 IMPLANT
TOWEL OR 17X24 6PK STRL BLUE (TOWEL DISPOSABLE) ×6 IMPLANT
TRAY FOLEY CATH SILVER 16FR (SET/KITS/TRAYS/PACK) ×3 IMPLANT
TRENDGUARD 450 HYBRID PRO PACK (MISCELLANEOUS) ×3
TRENDGUARD 600 HYBRID PROC PK (MISCELLANEOUS)
TROCAR XCEL NON-BLD 11X100MML (ENDOMECHANICALS) ×3 IMPLANT
TROCAR XCEL NON-BLD 5MMX100MML (ENDOMECHANICALS) ×3 IMPLANT
WARMER LAPAROSCOPE (MISCELLANEOUS) ×3 IMPLANT

## 2017-09-26 NOTE — Anesthesia Preprocedure Evaluation (Addendum)
Anesthesia Evaluation  Patient identified by MRN, date of birth, ID band Patient awake    Reviewed: Allergy & Precautions, NPO status , Patient's Chart, lab work & pertinent test results  Airway Mallampati: II  TM Distance: >3 FB     Dental   Pulmonary neg pulmonary ROS,    breath sounds clear to auscultation       Cardiovascular negative cardio ROS   Rhythm:Regular Rate:Normal     Neuro/Psych    GI/Hepatic negative GI ROS, Neg liver ROS,   Endo/Other  negative endocrine ROS  Renal/GU negative Renal ROS     Musculoskeletal  (+) Arthritis ,   Abdominal   Peds  Hematology   Anesthesia Other Findings   Reproductive/Obstetrics                             Anesthesia Physical Anesthesia Plan  ASA: II  Anesthesia Plan: General   Post-op Pain Management:    Induction: Intravenous  PONV Risk Score and Plan: 3 and Treatment may vary due to age or medical condition  Airway Management Planned: Oral ETT  Additional Equipment:   Intra-op Plan:   Post-operative Plan: Extubation in OR  Informed Consent: I have reviewed the patients History and Physical, chart, labs and discussed the procedure including the risks, benefits and alternatives for the proposed anesthesia with the patient or authorized representative who has indicated his/her understanding and acceptance.   Dental advisory given  Plan Discussed with: CRNA and Anesthesiologist  Anesthesia Plan Comments:         Anesthesia Quick Evaluation

## 2017-09-26 NOTE — Discharge Instructions (Signed)

## 2017-09-26 NOTE — Op Note (Signed)
09/26/2017  2:28 PM  PATIENT:  Judith Foster  41 y.o. female  PRE-OPERATIVE DIAGNOSIS:  N83.291 Complex Cyst Right Ovary  POST-OPERATIVE DIAGNOSIS:  Complex Cyst Right Ovary  PROCEDURE:  Procedure(s): LAPAROSCOPIC OVARIAN CYSTECTOMY (Right)  SURGEON:  Surgeon(s) and Role:    Gerald Leitz* Bree Heinzelman, MD - Primary    * Geryl RankinsVarnado, Evelyn, MD - Assisting due to concern for adhesive disease   PHYSICIAN ASSISTANT:   ASSISTANTS: Dr. Geryl RankinsEvelyn Varnado  ANESTHESIA:   general  EBL:  25 mL   BLOOD ADMINISTERED:none  DRAINS: Urinary Catheter (Foley)   LOCAL MEDICATIONS USED:  MARCAINE     SPECIMEN:  Source of Specimen:  Right ovarian dermoid  cyst   DISPOSITION OF SPECIMEN:  PATHOLOGY  COUNTS:  YES  TOURNIQUET:  * No tourniquets in log *  DICTATION: .Dragon Dictation  PLAN OF CARE: Discharge to home after PACU  PATIENT DISPOSITION:  PACU - hemodynamically stable.   Delay start of Pharmacological VTE agent (>24hrs) due to surgical blood loss or risk of bleeding: not applicable   Findings: .Marland Kitchen. Umbilical hernia defect noted.... normal appearing left fallopian tube and ovary. Right ovary with dermoid cyst ( hair within dermoid capsule) normal right fallopian tube.. 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 Fibroid uterus   Procedure:  the patient was taken to the operating room placed under general anesthesia. Time Out was performed.  She was  Prepped and draped in the normal sterile fashion. A foley catheter was placed. A uterine manipulator was placed. Attention was turned to the abdomen where the umbilicus was injected with 10 cc of marcaine. A 10 mm trocar was placed under direct visualization. Pneumoperitoneum was achieved with C02 gas... A 5 mm trocar was placed in the right and left lower quadrants. Each trocar site was injected with 10 cc of marcaine prior to trocar placement. The harmonic scalpel was used to excise right ovarian cyst.   An  endo catch bag was placed through the 10 mm umbilical port. The specimen was placed in the bag and removed through the umbilical incision. Pneumoperitoneum was reestablished.  The pelvis was irrigated. Excellent hemostasis was noted. All trocars were removed under direct visualization . The pneumoperitoneum was released.  The fascia of the umbilical incision was re approximated with 0 vicryl. The skin incisions were closed with 4-0 vicryl and derma bond.  the patient was taken to the recovery room awake and in stable condition.  Sponge lap and needle counts were correct times 2.

## 2017-09-26 NOTE — Transfer of Care (Signed)
Immediate Anesthesia Transfer of Care Note  Patient: Daylene PoseyKatrina Hutto  Procedure(s) Performed: LAPAROSCOPIC OVARIAN CYSTECTOMY (Right Abdomen)  Patient Location: PACU  Anesthesia Type:General  Level of Consciousness: drowsy and patient cooperative  Airway & Oxygen Therapy: Patient connected to nasal cannula oxygen  Post-op Assessment: Report given to RN and Post -op Vital signs reviewed and stable  Post vital signs: Reviewed and stable  Last Vitals:  Vitals:   09/26/17 1117  BP: (!) 131/91  Pulse: 83  Resp: 16  Temp: 36.7 C  SpO2: 100%    Last Pain:  Vitals:   09/26/17 1117  TempSrc: Oral      Patients Stated Pain Goal: 5 (09/26/17 1117)  Complications: No apparent anesthesia complications

## 2017-09-26 NOTE — Anesthesia Postprocedure Evaluation (Signed)
Anesthesia Post Note  Patient: Judith PoseyKatrina Foster  Procedure(s) Performed: LAPAROSCOPIC OVARIAN CYSTECTOMY (Right Abdomen)     Patient location during evaluation: PACU Anesthesia Type: General Level of consciousness: awake Pain management: pain level controlled Vital Signs Assessment: post-procedure vital signs reviewed and stable Respiratory status: spontaneous breathing Cardiovascular status: stable Postop Assessment: no apparent nausea or vomiting Anesthetic complications: no    Last Vitals:  Vitals:   09/26/17 1515 09/26/17 1530  BP: (!) 137/92 (!) 140/91  Pulse: 82 85  Resp: 18 17  Temp:    SpO2: 93% 95%    Last Pain:  Vitals:   09/26/17 1515  TempSrc:   PainSc: 0-No pain   Pain Goal: Patients Stated Pain Goal: 5 (09/26/17 1117)               Kahle Mcqueen JR,JOHN Susann GivensFRANKLIN

## 2017-09-26 NOTE — Anesthesia Procedure Notes (Signed)
Procedure Name: Intubation Date/Time: 09/26/2017 1:14 PM Performed by: Georgeanne Nim, CRNA Pre-anesthesia Checklist: Patient identified, Emergency Drugs available, Suction available, Patient being monitored and Timeout performed Patient Re-evaluated:Patient Re-evaluated prior to induction Oxygen Delivery Method: Circle system utilized Preoxygenation: Pre-oxygenation with 100% oxygen Induction Type: IV induction Ventilation: Mask ventilation without difficulty Laryngoscope Size: Mac and 3 Grade View: Grade I Tube type: Oral Tube size: 7.0 mm Number of attempts: 1 Airway Equipment and Method: Stylet Placement Confirmation: ETT inserted through vocal cords under direct vision,  positive ETCO2,  CO2 detector and breath sounds checked- equal and bilateral Secured at: 21 cm Tube secured with: Tape Dental Injury: Teeth and Oropharynx as per pre-operative assessment

## 2017-09-26 NOTE — H&P (Signed)
Date of Initial H&P: 09/17/2017  History reviewed, patient examined, no change in status, stable for surgery.  

## 2017-09-27 ENCOUNTER — Encounter (HOSPITAL_COMMUNITY): Payer: Self-pay | Admitting: Obstetrics and Gynecology

## 2017-09-27 NOTE — Progress Notes (Signed)
Judith Foster is a 41 y.o. No obstetric history on file. at Unknown by LMP admitted for induction of labor due to gestational hypertension .  Subjective: Patient feeling urge to push  Objective: BP 137/84 (BP Location: Right Arm)   Pulse 84   Temp 98.3 F (36.8 C)   Resp 18   LMP 09/16/2017 (Exact Date)   SpO2 100%  I/O last 3 completed shifts: In: 2100 [I.V.:2100] Out: 125 [Urine:100; Blood:25] No intake/output data recorded.  FHT:  FHR: 130 bpm, variability: moderate,  accelerations:  Abscent,  decelerations:  Absent UC:   regular, every 2 minutes SVE:    Complete 0 station   Labs: Lab Results  Component Value Date   WBC 5.8 09/18/2017   HGB 12.2 09/18/2017   HCT 37.2 09/18/2017   MCV 87.9 09/18/2017   PLT 331 09/18/2017    Assessment / Plan: Induction of labor due to gestational hypertension   Labor: complete and pushing 0 station  Preeclampsia:  labs stable Fetal Wellbeing:  Category I Pain Control:  Epidural I/D:  Penicillin Anticipated MOD:  NSVD  Theda Payer J. 09/27/2017, 7:03 AM

## 2017-11-24 IMAGING — CR DG FOOT COMPLETE 3+V*R*
3 series · 3 of 3 positions shown · non-contrast
Comparison: None.

CLINICAL DATA: Injury to RIGHT foot 2 weeks ago / c/o lingering
pain at BIG TOE MTP jt/ no obvious DEMISSIE or contusion / concern for FX

EXAM:
RIGHT FOOT COMPLETE - 3+ VIEW

[x foot ap right]
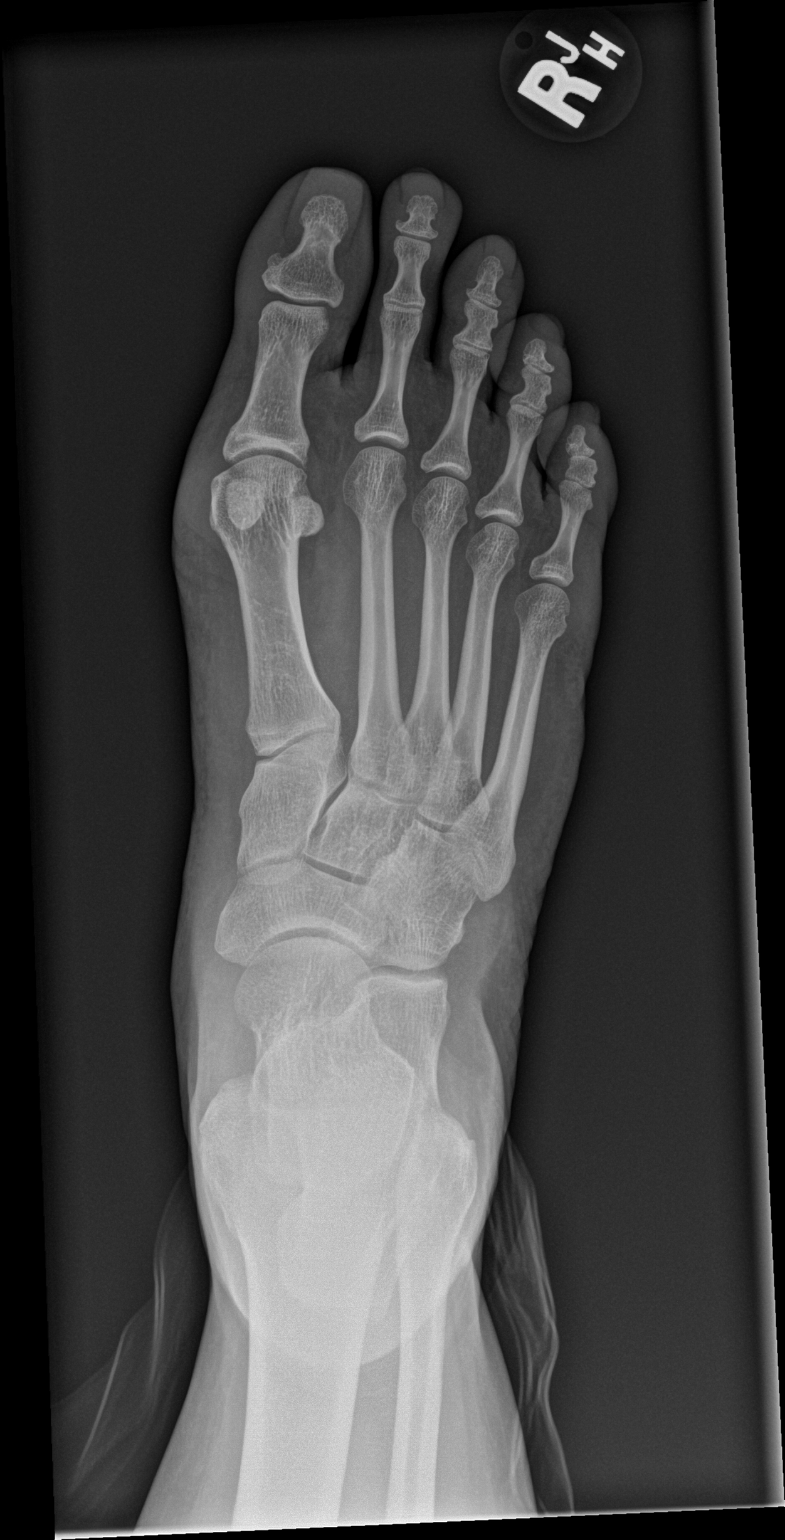

[x foot obl right]
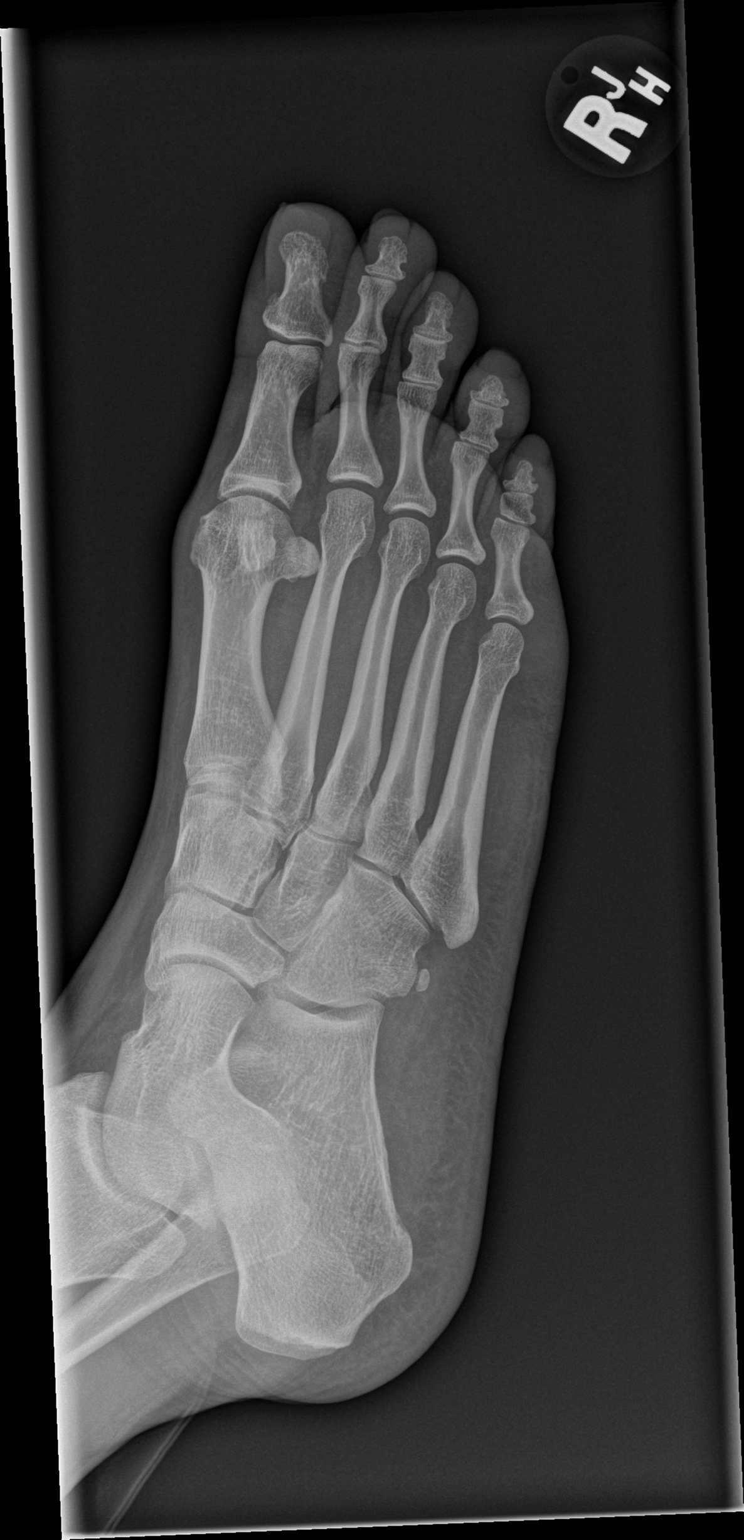

[x foot lat right]
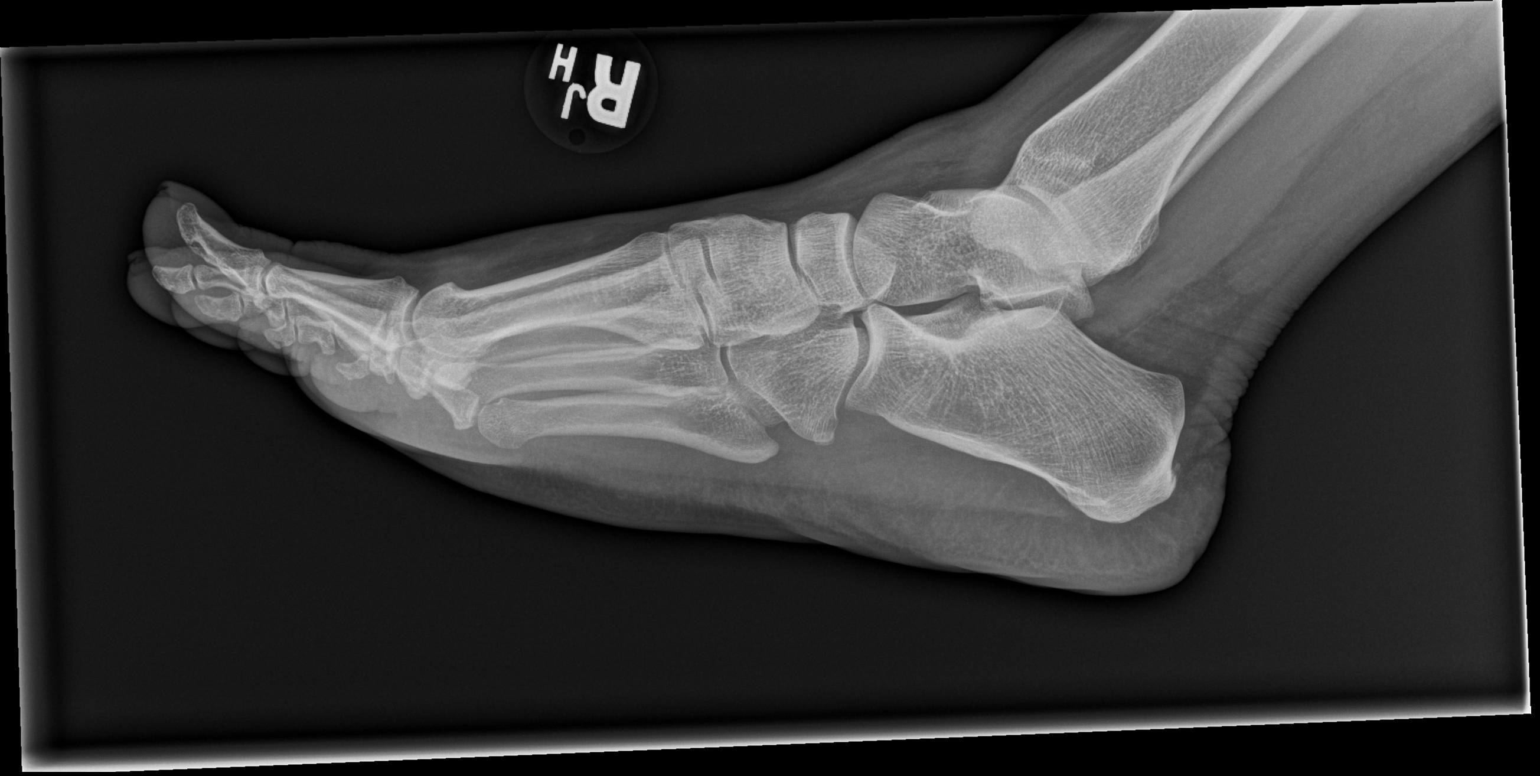

[3 of 3 positions shown; findings below may reference images not displayed]

FINDINGS: There is no evidence of fracture or dislocation. There is no
evidence of arthropathy or other focal bone abnormality. Soft
tissues are unremarkable.
IMPRESSION: Negative.
